# Patient Record
Sex: Male | Born: 1980 | Race: White | Hispanic: No
Health system: Southern US, Community
[De-identification: ages and names within clinical notes are randomized; demographics above are authoritative.]

---

## 2014-10-04 ENCOUNTER — Emergency Department (HOSPITAL_COMMUNITY): Payer: 59

## 2014-10-04 ENCOUNTER — Encounter (HOSPITAL_COMMUNITY): Payer: Self-pay | Admitting: Emergency Medicine

## 2014-10-04 ENCOUNTER — Emergency Department (HOSPITAL_COMMUNITY)
Admission: EM | Admit: 2014-10-04 | Discharge: 2014-10-04 | Disposition: A | Discharge status: Home / Self Care | Payer: 59 | Attending: Emergency Medicine | Admitting: Emergency Medicine

## 2014-10-04 DIAGNOSIS — F10920 Alcohol use, unspecified with intoxication, uncomplicated: Secondary | ICD-10-CM

## 2014-10-04 DIAGNOSIS — F10129 Alcohol abuse with intoxication, unspecified: Secondary | ICD-10-CM | POA: Diagnosis present

## 2014-10-04 DIAGNOSIS — F10120 Alcohol abuse with intoxication, uncomplicated: Secondary | ICD-10-CM | POA: Insufficient documentation

## 2014-10-04 DIAGNOSIS — F1210 Cannabis abuse, uncomplicated: Secondary | ICD-10-CM | POA: Diagnosis not present

## 2014-10-04 LAB — I-STAT CHEM 8, ED
BUN: 10 mg/dL (ref 6–20)
CHLORIDE: 106 mmol/L (ref 101–111)
Calcium, Ion: 1.12 mmol/L (ref 1.12–1.23)
Creatinine, Ser: 1.4 mg/dL — ABNORMAL HIGH (ref 0.61–1.24)
Glucose, Bld: 106 mg/dL — ABNORMAL HIGH (ref 65–99)
HEMATOCRIT: 43 % (ref 39.0–52.0)
HEMOGLOBIN: 14.6 g/dL (ref 13.0–17.0)
POTASSIUM: 3.8 mmol/L (ref 3.5–5.1)
SODIUM: 142 mmol/L (ref 135–145)
TCO2: 21 mmol/L (ref 0–100)

## 2014-10-04 LAB — CBC WITH DIFFERENTIAL/PLATELET
BASOS PCT: 1 % (ref 0–1)
Basophils Absolute: 0 10*3/uL (ref 0.0–0.1)
EOS PCT: 1 % (ref 0–5)
Eosinophils Absolute: 0.1 10*3/uL (ref 0.0–0.7)
HCT: 41.9 % (ref 39.0–52.0)
HEMOGLOBIN: 14.2 g/dL (ref 13.0–17.0)
LYMPHS ABS: 2.4 10*3/uL (ref 0.7–4.0)
LYMPHS PCT: 34 % (ref 12–46)
MCH: 28.4 pg (ref 26.0–34.0)
MCHC: 33.9 g/dL (ref 30.0–36.0)
MCV: 83.8 fL (ref 78.0–100.0)
MONOS PCT: 8 % (ref 3–12)
Monocytes Absolute: 0.6 10*3/uL (ref 0.1–1.0)
NEUTROS ABS: 4 10*3/uL (ref 1.7–7.7)
Neutrophils Relative %: 56 % (ref 43–77)
Platelets: 282 10*3/uL (ref 150–400)
RBC: 5 MIL/uL (ref 4.22–5.81)
RDW: 13.1 % (ref 11.5–15.5)
WBC: 7.2 10*3/uL (ref 4.0–10.5)

## 2014-10-04 LAB — SALICYLATE LEVEL: Salicylate Lvl: <4 mg/dL (ref 2.8–30.0)

## 2014-10-04 LAB — ACETAMINOPHEN LEVEL: Acetaminophen (Tylenol), Serum: <10 ug/mL — ABNORMAL LOW (ref 10–30)

## 2014-10-04 LAB — RAPID URINE DRUG SCREEN, HOSP PERFORMED
Amphetamines: NOT DETECTED
BARBITURATES: NOT DETECTED
Benzodiazepines: NOT DETECTED
COCAINE: NOT DETECTED
Opiates: NOT DETECTED
TETRAHYDROCANNABINOL: POSITIVE — AB

## 2014-10-04 LAB — ETHANOL: ALCOHOL ETHYL (B): 222 mg/dL — AB (ref <5)

## 2014-10-04 MED ORDER — SODIUM CHLORIDE 0.9 % IV SOLN
1000.0000 mL | Freq: Once | INTRAVENOUS | Status: AC
  Administered 2014-10-04: 1000 mL via INTRAVENOUS

## 2014-10-04 MED ORDER — ONDANSETRON HCL 4 MG/2ML IJ SOLN
4.0000 mg | Freq: Once | INTRAMUSCULAR | Status: AC
  Administered 2014-10-04: 4 mg via INTRAVENOUS
  Filled 2014-10-04: qty 2

## 2014-10-04 NOTE — Discharge Instructions (Signed)
Alcohol Intoxication  Alcohol intoxication occurs when the amount of alcohol that a person has consumed impairs his or her ability to mentally and physically function. Alcohol directly impairs the normal chemical activity of the brain. Drinking large amounts of alcohol can lead to changes in mental function and behavior, and it can cause many physical effects that can be harmful.   Alcohol intoxication can range in severity from mild to very severe. Various factors can affect the level of intoxication that occurs, such as the person's age, gender, weight, frequency of alcohol consumption, and the presence of other medical conditions (such as diabetes, seizures, or heart conditions). Dangerous levels of alcohol intoxication may occur when people drink large amounts of alcohol in a short period (binge drinking). Alcohol can also be especially dangerous when combined with certain prescription medicines or "recreational" drugs.  SIGNS AND SYMPTOMS  Some common signs and symptoms of mild alcohol intoxication include:  · Loss of coordination.  · Changes in mood and behavior.  · Impaired judgment.  · Slurred speech.  As alcohol intoxication progresses to more severe levels, other signs and symptoms will appear. These may include:  · Vomiting.  · Confusion and impaired memory.  · Slowed breathing.  · Seizures.  · Loss of consciousness.  DIAGNOSIS   Your health care provider will take a medical history and perform a physical exam. You will be asked about the amount and type of alcohol you have consumed. Blood tests will be done to measure the concentration of alcohol in your blood. In many places, your blood alcohol level must be lower than 80 mg/dL (0.08%) to legally drive. However, many dangerous effects of alcohol can occur at much lower levels.   TREATMENT   People with alcohol intoxication often do not require treatment. Most of the effects of alcohol intoxication are temporary, and they go away as the alcohol naturally  leaves the body. Your health care provider will monitor your condition until you are stable enough to go home. Fluids are sometimes given through an IV access tube to help prevent dehydration.   HOME CARE INSTRUCTIONS  · Do not drive after drinking alcohol.  · Stay hydrated. Drink enough water and fluids to keep your urine clear or pale yellow. Avoid caffeine.    · Only take over-the-counter or prescription medicines as directed by your health care provider.    SEEK MEDICAL CARE IF:   · You have persistent vomiting.    · You do not feel better after a few days.  · You have frequent alcohol intoxication. Your health care provider can help determine if you should see a substance use treatment counselor.  SEEK IMMEDIATE MEDICAL CARE IF:   · You become shaky or tremble when you try to stop drinking.    · You shake uncontrollably (seizure).    · You throw up (vomit) blood. This may be bright red or may look like black coffee grounds.    · You have blood in your stool. This may be bright red or may appear as a black, tarry, bad smelling stool.    · You become lightheaded or faint.    MAKE SURE YOU:   · Understand these instructions.  · Will watch your condition.  · Will get help right away if you are not doing well or get worse.  Document Released: 11/23/2004 Document Revised: 10/16/2012 Document Reviewed: 07/19/2012  [employer]® Patient Information ©2015 [employer]. This information is not intended to replace advice given to you by your health care provider. Make sure   you discuss any questions you have with your health care provider.

## 2014-10-04 NOTE — ED Notes (Signed)
Pt asleep, snoring loudly, chest raise equal, breathing unlabored, VSS. Will continue to monitor.

## 2014-10-04 NOTE — ED Notes (Signed)
Pt's valuables locked in security lock box, wallet, cash and cell phone.  Pt's friend called and given number to ED.

## 2014-10-04 NOTE — ED Notes (Signed)
Patient is snoring. Respirations maintaining sats 95% on 3L nasal cannula.

## 2014-10-04 NOTE — ED Notes (Signed)
Bed: RESA Expected date:  Expected time:  Means of arrival:  Comments: 3M unresponsive NRB

## 2014-10-04 NOTE — ED Provider Notes (Signed)
CSN: [id number]     Arrival date & time 10/04/14  0555 History   First MD Initiated Contact with Patient 10/04/14 [phone]     Chief Complaint  Patient presents with  . Alcohol Intoxication     (Consider location/radiation/quality/duration/timing/severity/associated sxs/prior Treatment) Patient is a 34 y.o. male presenting with intoxication. The history is provided by the EMS personnel. The history is limited by the condition of the patient.  Alcohol Intoxication This is a new problem. Episode onset: unknown. The problem occurs constantly. The problem has not changed since onset.Pertinent negatives include no abdominal pain. Nothing aggravates the symptoms. Nothing relieves the symptoms. He has tried nothing for the symptoms. The treatment provided no relief.  Admits to drinking 2/5 of ETOH  History reviewed. No pertinent past medical history. History reviewed. No pertinent past surgical history. History reviewed. No pertinent family history. History  Substance Use Topics  . Smoking status: Not on file  . Smokeless tobacco: Not on file  . Alcohol Use: Yes    Review of Systems  Unable to perform ROS Gastrointestinal: Negative for abdominal pain.      Allergies  Review of patient's allergies indicates no known allergies.  Home Medications   Prior to Admission medications   Not on File   BP 123/55 mmHg  Pulse 99  Temp(Src) 97.7 F (36.5 C) (Oral)  Resp 22  SpO2 94% Physical Exam  Constitutional: He appears well-developed and well-nourished. No distress.  HENT:  Head: Normocephalic and atraumatic. Head is without raccoon's eyes and without Battle's sign.  Right Ear: No hemotympanum.  Left Ear: No hemotympanum.  Mouth/Throat: Oropharynx is clear and moist.  Eyes: Conjunctivae are normal. Pupils are equal, round, and reactive to light.  Neck: Normal range of motion. Neck supple.  Cardiovascular: Normal rate, regular rhythm and intact distal pulses.   Pulmonary/Chest:  Effort normal and breath sounds normal. No respiratory distress. He has no wheezes. He has no rales.  Abdominal: Soft. Bowel sounds are normal. There is no tenderness. There is no rebound and no guarding.  Musculoskeletal: Normal range of motion. He exhibits no edema or tenderness.  Neurological: He has normal reflexes. He displays normal reflexes. GCS eye subscore is 3. GCS verbal subscore is 5. GCS motor subscore is 6.  Reflex Scores:      Tricep reflexes are 2+ on the right side and 2+ on the left side.      Bicep reflexes are 2+ on the right side and 2+ on the left side.      Brachioradialis reflexes are 2+ on the right side and 2+ on the left side.      Patellar reflexes are 2+ on the right side and 2+ on the left side.      Achilles reflexes are 2+ on the right side and 2+ on the left side. Skin: Skin is warm and dry.  Psychiatric:  unable    ED Course  Procedures (including critical care time) Labs Review Labs Reviewed  ACETAMINOPHEN LEVEL - Abnormal; Notable for the following:    Acetaminophen (Tylenol), Serum <10 (*)    All other components within normal limits  ETHANOL - Abnormal; Notable for the following:    Alcohol, Ethyl (B) 222 (*)    All other components within normal limits  I-STAT CHEM 8, ED - Abnormal; Notable for the following:    Creatinine, Ser 1.40 (*)    Glucose, Bld 106 (*)    All other components within normal limits  CBC WITH  DIFFERENTIAL/PLATELET  SALICYLATE LEVEL  URINE RAPID DRUG SCREEN, HOSP PERFORMED    Imaging Review No results found.   EKG Interpretation   Date/Time:  Sunday October 04 2014 06:00:35 EDT Ventricular Rate:  102 PR Interval:  179 QRS Duration: 107 QT Interval:  359 QTC Calculation: 468 R Axis:   34 Text Interpretation:  Sinus tachycardia Confirmed by [facility]  MD,  Morene Antu ([id number]) on 10/04/2014 6:33:33 AM      MDM   Final diagnoses:  None    SOBER up and walk and po challenge    Hoby Kawai, MD 10/04/14  [phone]

## 2014-10-04 NOTE — ED Notes (Signed)
Spoke with patients friend Loura Back. He states patient is in town for wedding, he will on to see patient

## 2014-10-04 NOTE — ED Notes (Signed)
Pt ambulated to bathroom independently with a steady gait

## 2014-10-04 NOTE — ED Notes (Signed)
Pt has turned self on side. Will continue to monitor.

## 2014-10-04 NOTE — ED Notes (Signed)
Pt now answering some questions. Pt arouses to painful stimuli. Once patient is aroused and repeatedly stimulated he is more talkative. Pt then falls back to sleep and become difficult to arouse once again.

## 2014-10-04 NOTE — ED Notes (Addendum)
Patient arrived by [facility] with alcohol intoxication. Patient was found underneath a car, unclear how he got there. Patient was responsive to patient stimuli upon arrival. No obvious deformities. Bruise noted to the left upper arm.

## 2014-10-04 NOTE — ED Notes (Signed)
Pt awake eating breakfast, spoke to his friend Loraine Leriche who states he will be here soon to pick up pt.

## 2016-05-08 IMAGING — CT CT HEAD W/O CM
3 of 6 series · 14 of 47 positions shown, 16 images · non-contrast
Comparison: None.

CLINICAL DATA: Altered mental status, alcohol intoxication, found
on anything car, unresponsive

EXAM:
CT HEAD WITHOUT CONTRAST
CT CERVICAL SPINE WITHOUT CONTRAST
TECHNIQUE: Multidetector CT imaging of the head and cervical spine was
performed following the standard protocol without intravenous
contrast. Multiplanar CT image reconstructions of the cervical spine
were also generated.

[Series 8: axial recon · axial · 0.23mm/px · z∈[-349,-209]mm · 8 of 99 slices shown, 10 images]
[im 11/99  brain]
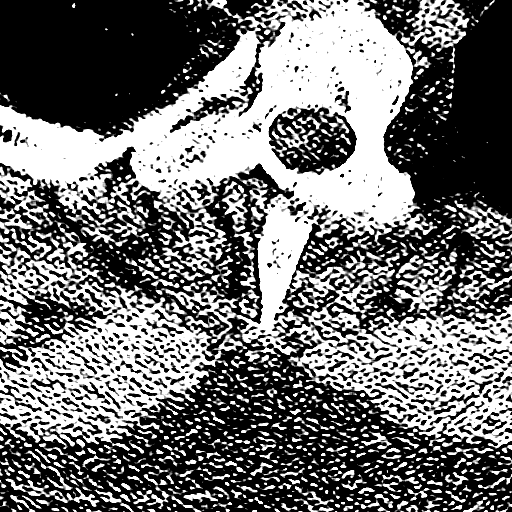
[im 11/99  bone]
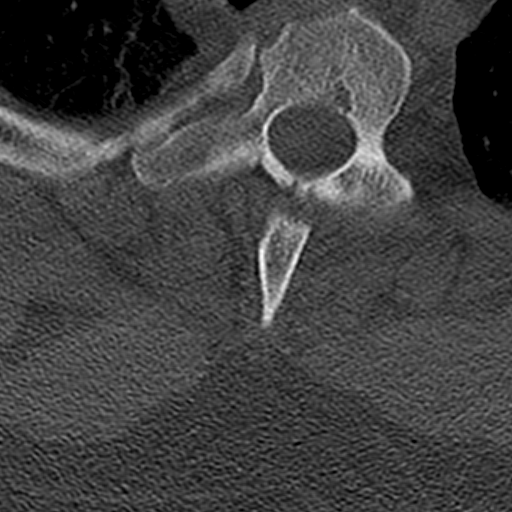
[im 22/99  brain]
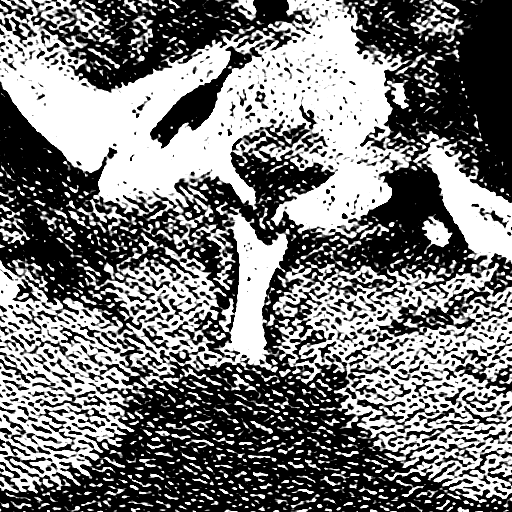
[im 33/99  brain]
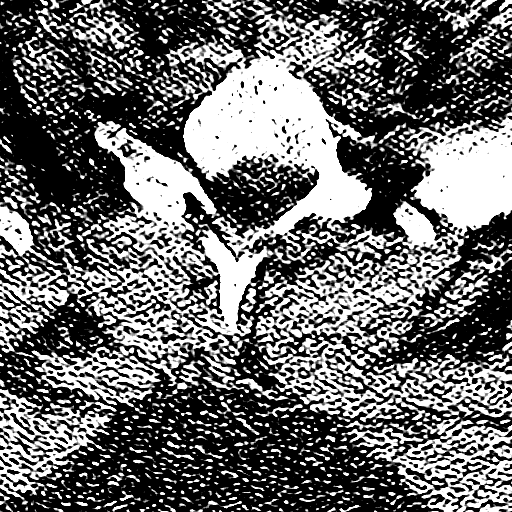
[im 44/99  brain]
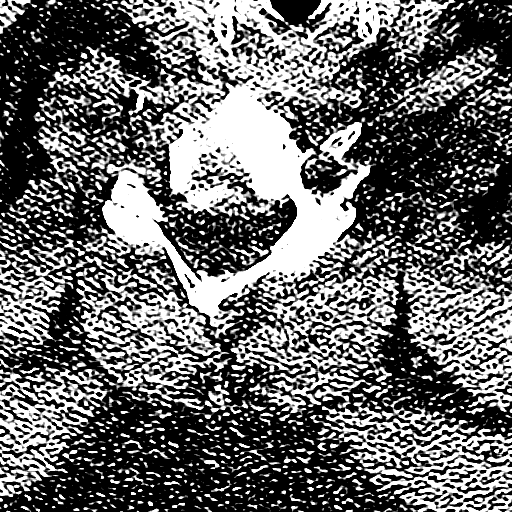
[im 55/99  brain]
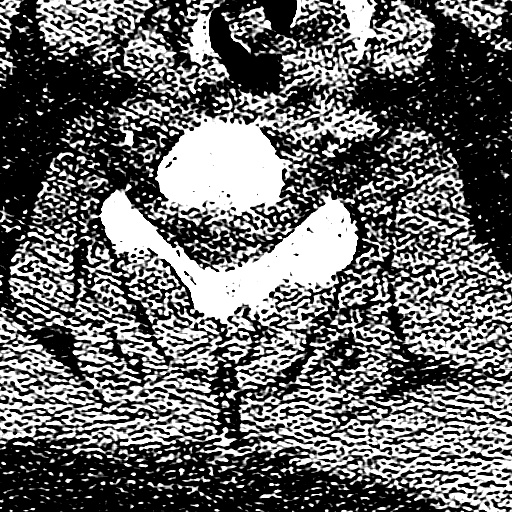
[im 55/99  bone]
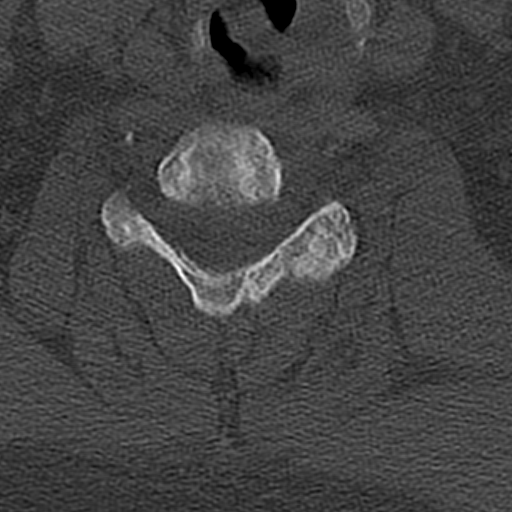
[im 66/99  brain]
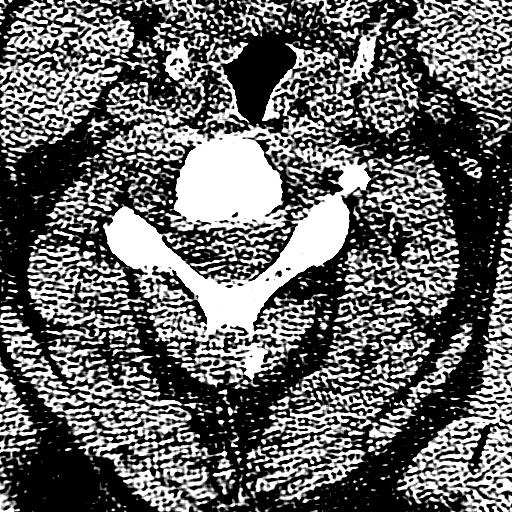
[im 77/99  brain]
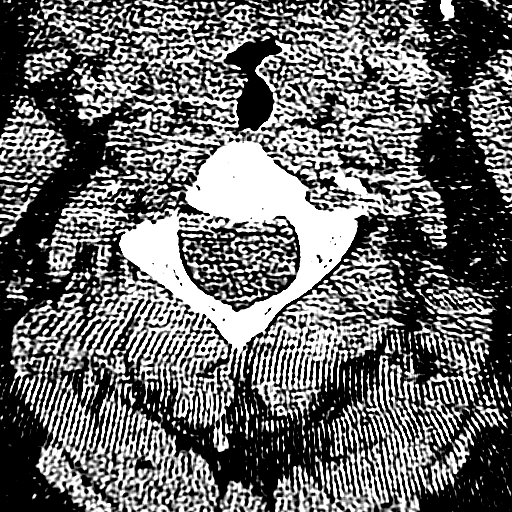
[im 88/99  brain]
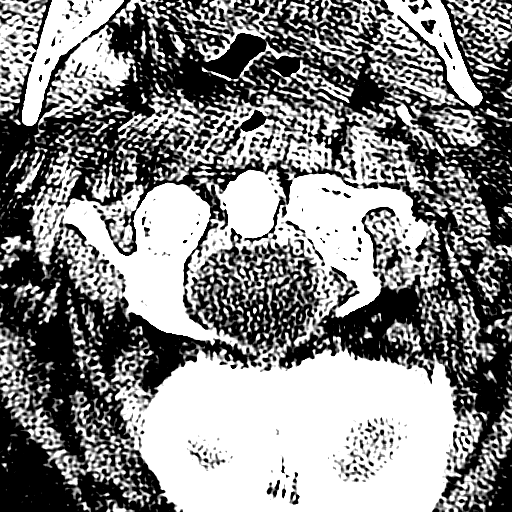

[Series 9: coronal · coronal · 0.29mm/px · 3 of 43 slices shown]
[im 15/43  brain]
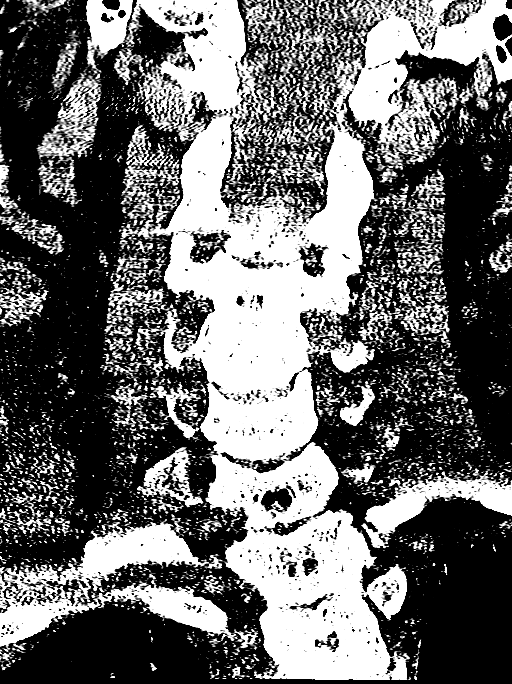
[im 19/43  brain]
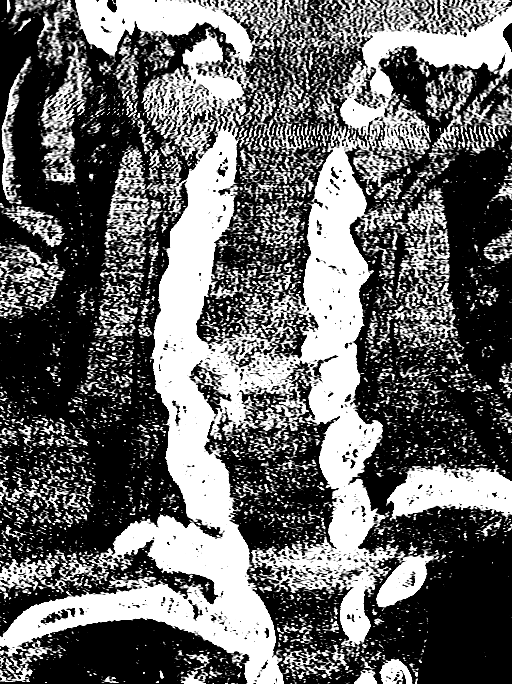
[im 24/43  brain]
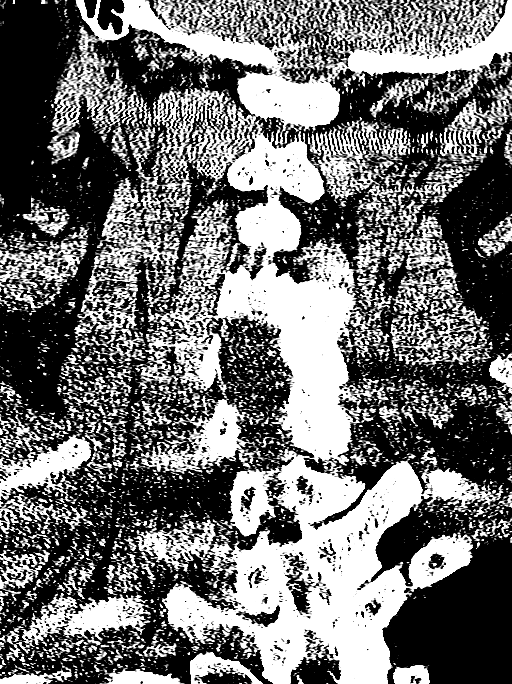

[Series 10: sagittal · sagittal · 0.24mm/px · 3 of 40 slices shown]
[im 14/40  brain]
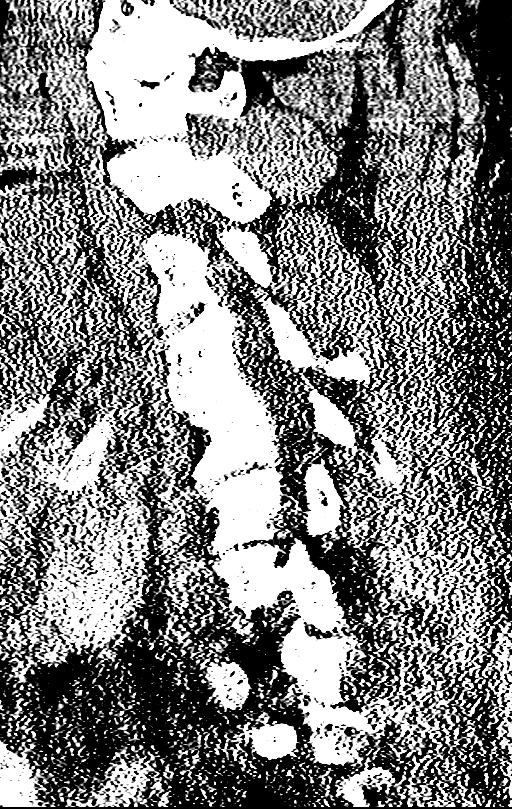
[im 20/40  brain]
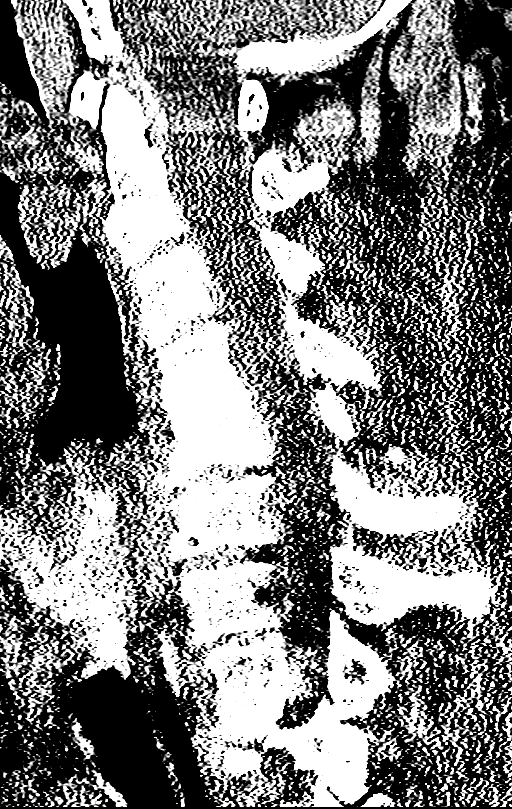
[im 27/40  brain]
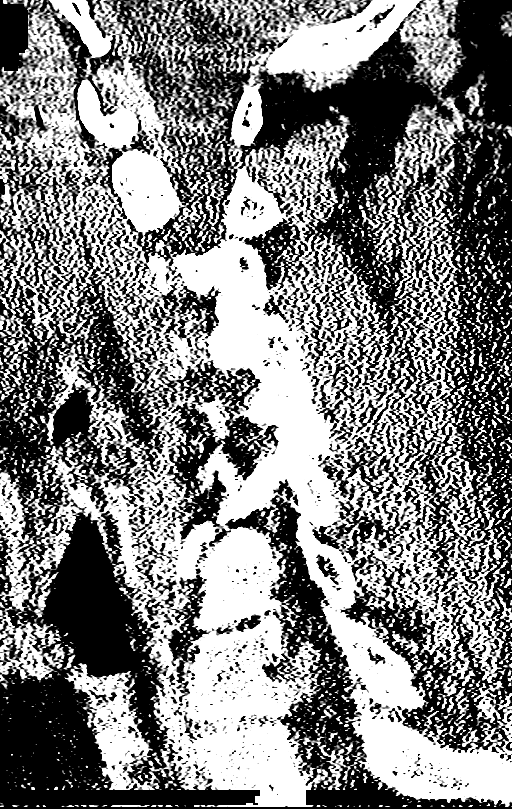

[14 of 47 positions shown; findings below may reference images not displayed]

FINDINGS: CT HEAD FINDINGS

Evidence of prior right frontal craniotomy. No acute abnormalities
involving the calvarium. No intracranial hemorrhage or extra-axial
fluid. No infarct mass or hydrocephalus.

CT CERVICAL SPINE FINDINGS

Reversed lordosis. C4-5 disc space nearly fused. C5-6 and C6-7 show
significant degenerative disc disease with large osteophytes.
Moderate dextroscoliosis. No prevertebral soft tissue swelling. No
fracture. No acute soft tissue abnormalities.
IMPRESSION: No acute abnormality involving cervical spine or head. Evidence of
prior right craniotomy. Scoliosis with significant degenerative
change cervical spine.
